# Patient Record
Sex: Female | Born: 1980
Health system: Southern US, Community
[De-identification: ages and names within clinical notes are randomized; demographics above are authoritative.]

## PROBLEM LIST (undated history)

## (undated) DIAGNOSIS — Z9889 Other specified postprocedural states: Secondary | ICD-10-CM

## (undated) HISTORY — PX: MANDIBLE FRACTURE SURGERY: SHX706

---
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
## 2007-12-10 ENCOUNTER — Emergency Department (HOSPITAL_COMMUNITY): Admission: EM | Admit: 2007-12-10 | Discharge: 2007-12-10 | Payer: Self-pay | Admitting: Family Medicine

## 2010-03-25 ENCOUNTER — Emergency Department (HOSPITAL_COMMUNITY): Admission: EM | Admit: 2010-03-25 | Discharge: 2010-03-25 | Payer: Self-pay | Admitting: Emergency Medicine

## 2016-02-29 ENCOUNTER — Ambulatory Visit (HOSPITAL_COMMUNITY)
Admission: EM | Admit: 2016-02-29 | Discharge: 2016-02-29 | Disposition: A | Discharge status: Home / Self Care | Payer: Commercial Managed Care - HMO | Attending: Family Medicine | Admitting: Family Medicine

## 2016-02-29 ENCOUNTER — Ambulatory Visit (INDEPENDENT_AMBULATORY_CARE_PROVIDER_SITE_OTHER): Payer: Commercial Managed Care - HMO

## 2016-02-29 ENCOUNTER — Encounter (HOSPITAL_COMMUNITY): Payer: Self-pay | Admitting: Emergency Medicine

## 2016-02-29 DIAGNOSIS — S93402A Sprain of unspecified ligament of left ankle, initial encounter: Secondary | ICD-10-CM

## 2016-02-29 HISTORY — DX: Other specified postprocedural states: Z98.890

## 2016-02-29 NOTE — Discharge Instructions (Signed)
Wear ankle support as needed for comfort, activity as tolerated. advil and warm soaks as needed, return or see orthopedist if further problems. °

## 2016-02-29 NOTE — ED Notes (Signed)
Patient reports missing a step on Thursday (02/25/16), twisted her left ankle.  Reports wrapping ankle over the weekend and did a lot of walking.  Reports swelling is improved.  Some swelling remains and slight discoloration.

## 2016-02-29 NOTE — ED Provider Notes (Signed)
CSN: [id number]     Arrival date & time 02/29/16  1258 History   First MD Initiated Contact with Patient 02/29/16 1312     Chief Complaint  Patient presents with  . Ankle Pain   (Consider location/radiation/quality/duration/timing/severity/associated sxs/prior Treatment) Patient is a 35 y.o. female presenting with ankle pain. The history is provided by the patient.  Ankle Pain Location:  Ankle Time since incident:  4 days Injury: yes   Mechanism of injury comment:  Rolled ankle then went to [facility] and did a lot of walking. Ankle location:  L ankle Pain details:    Quality:  Pressure   Severity:  Mild   Progression:  Unchanged Chronicity:  New Dislocation: no   Relieved by:  None tried Ineffective treatments:  Compression   Past Medical History  Diagnosis Date  . H/O thumb surgery    Past Surgical History  Procedure Laterality Date  . Mandible fracture surgery     No family history on file. Social History  Substance Use Topics  . Smoking status: Never Smoker   . Smokeless tobacco: None  . Alcohol Use: Yes   OB History    No data available     Review of Systems  Constitutional: Negative.   Musculoskeletal: Positive for joint swelling.  Skin: Negative.   All other systems reviewed and are negative.   Allergies  Review of patient's allergies indicates no known allergies.  Home Medications   Prior to Admission medications   Medication Sig Start Date End Date Taking? Authorizing Provider  ALPRAZolam (XANAX XR) 1 MG 24 hr tablet Take 1 mg by mouth daily.   Yes Historical Provider, MD  amphetamine-dextroamphetamine (ADDERALL) 30 MG tablet Take 30 mg by mouth daily.   Yes Historical Provider, MD  ibuprofen (ADVIL,MOTRIN) 200 MG tablet Take 200 mg by mouth every 6 (six) hours as needed.   Yes Historical Provider, MD   Meds Ordered and Administered this Visit  Medications - No data to display  BP 155/97 mmHg  Pulse 100  Temp(Src) 99 F (37.2 C) (Oral)   Resp 16  SpO2 100%  LMP 02/11/2016 No data found.   Physical Exam  Constitutional: She is oriented to person, place, and time. She appears well-developed and well-nourished. No distress.  Musculoskeletal: She exhibits tenderness.       Left ankle: She exhibits decreased range of motion, swelling and ecchymosis. She exhibits normal pulse. Tenderness. Lateral malleolus and AITFL tenderness found. No medial malleolus, no head of 5th metatarsal and no proximal fibula tenderness found. Achilles tendon normal.  Neurological: She is alert and oriented to person, place, and time.  Skin: Skin is warm and dry.  Nursing note and vitals reviewed.   ED Course  Procedures (including critical care time)  Labs Review Labs Reviewed - No data to display  Imaging Review Dg Ankle Complete Left  02/29/2016  CLINICAL DATA:  Pt fell off a step Thursday morning leaving to catch a plane,pain to the lateral side of the foot still, pain was also at the instep when it first happened EXAM: LEFT ANKLE COMPLETE - 3+ VIEW COMPARISON:  None. FINDINGS: There is no evidence of fracture, dislocation, or joint effusion. There is no evidence of arthropathy or other focal bone abnormality. Soft tissues are unremarkable. IMPRESSION: Negative. Electronically Signed   By: Esperanza Heir M.D.   On: 02/29/2016 13:43   X-rays reviewed and report per radiologist.   Visual Acuity Review  Right Eye Distance:   Left  Eye Distance:   Bilateral Distance:    Right Eye Near:   Left Eye Near:    Bilateral Near:         MDM   1. Ankle sprain, left, initial encounter    Pt does not require pain meds.    Linna HoffJames D Kindl, MD 02/29/16 [phone]

## 2017-01-18 DIAGNOSIS — Z6822 Body mass index (BMI) 22.0-22.9, adult: Secondary | ICD-10-CM | POA: Diagnosis not present

## 2017-01-18 DIAGNOSIS — Z01419 Encounter for gynecological examination (general) (routine) without abnormal findings: Secondary | ICD-10-CM | POA: Diagnosis not present

## 2017-03-02 DIAGNOSIS — R030 Elevated blood-pressure reading, without diagnosis of hypertension: Secondary | ICD-10-CM | POA: Diagnosis not present

## 2017-09-01 DIAGNOSIS — R030 Elevated blood-pressure reading, without diagnosis of hypertension: Secondary | ICD-10-CM | POA: Diagnosis not present

## 2017-09-01 DIAGNOSIS — Z13220 Encounter for screening for lipoid disorders: Secondary | ICD-10-CM | POA: Diagnosis not present

## 2017-09-01 DIAGNOSIS — Z23 Encounter for immunization: Secondary | ICD-10-CM | POA: Diagnosis not present

## 2017-10-16 IMAGING — DX DG ANKLE COMPLETE 3+V*L*
3 series · 3 of 3 positions shown · non-contrast
Comparison: None.

CLINICAL DATA: Pt fell off a step [REDACTED] morning leaving to catch
a plane,pain to the lateral side of the foot still, pain was also at
the instep when it first happened

EXAM:
LEFT ANKLE COMPLETE - 3+ VIEW

[ankle ap]
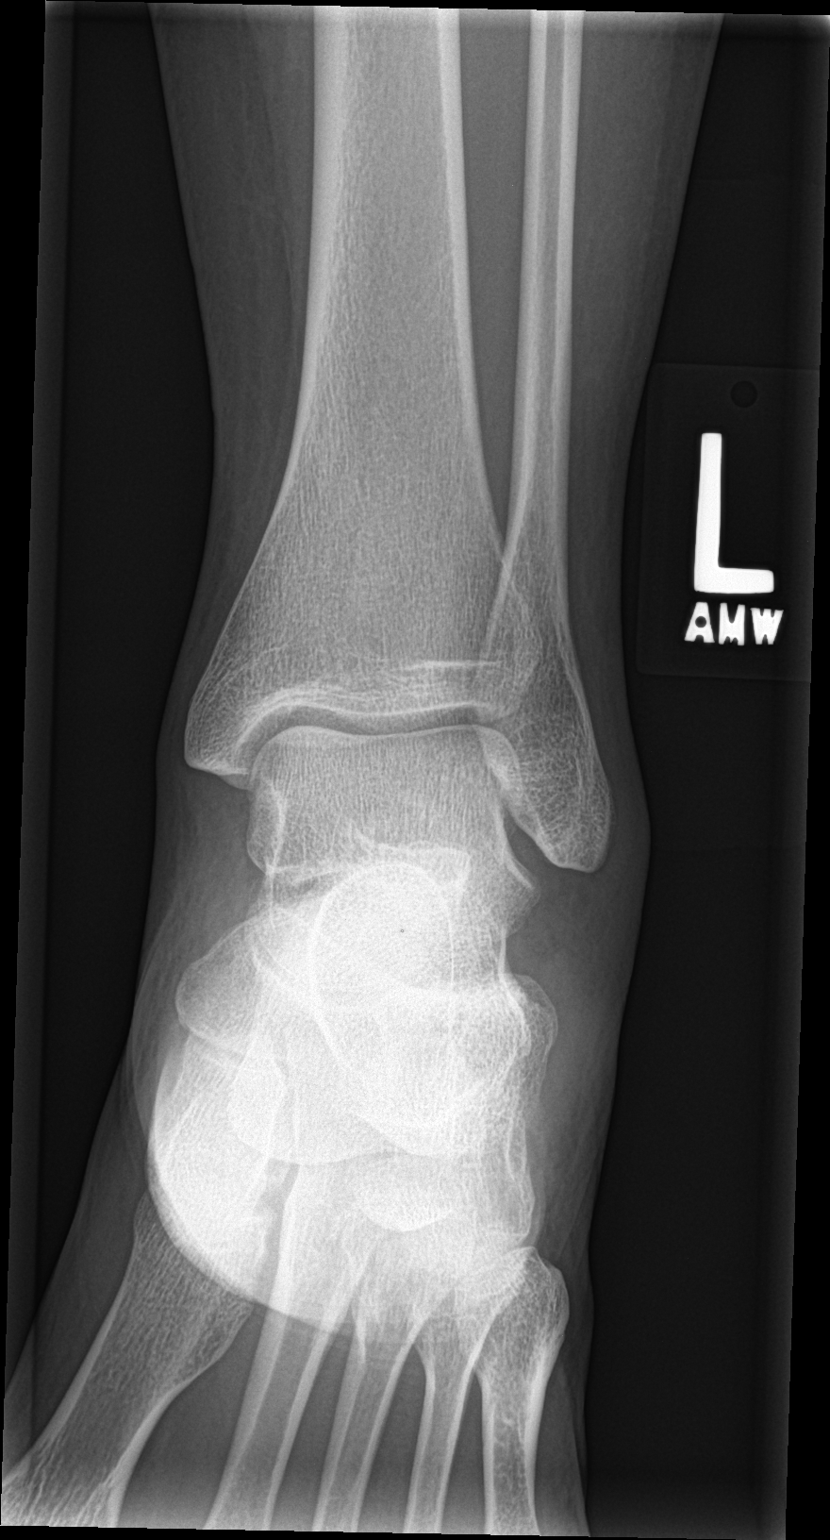

[ankle obl]
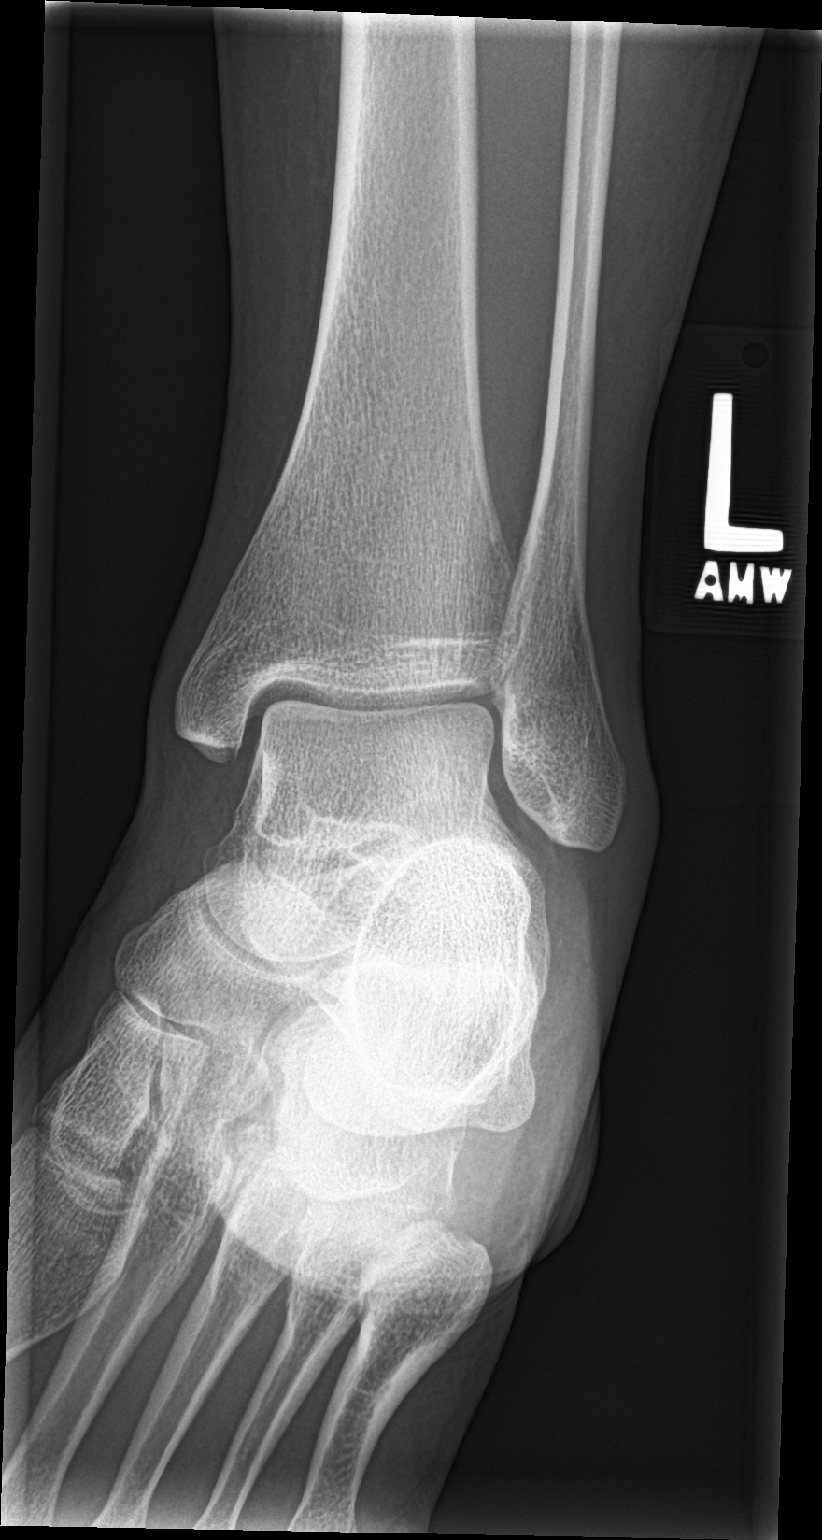

[ankle lat]
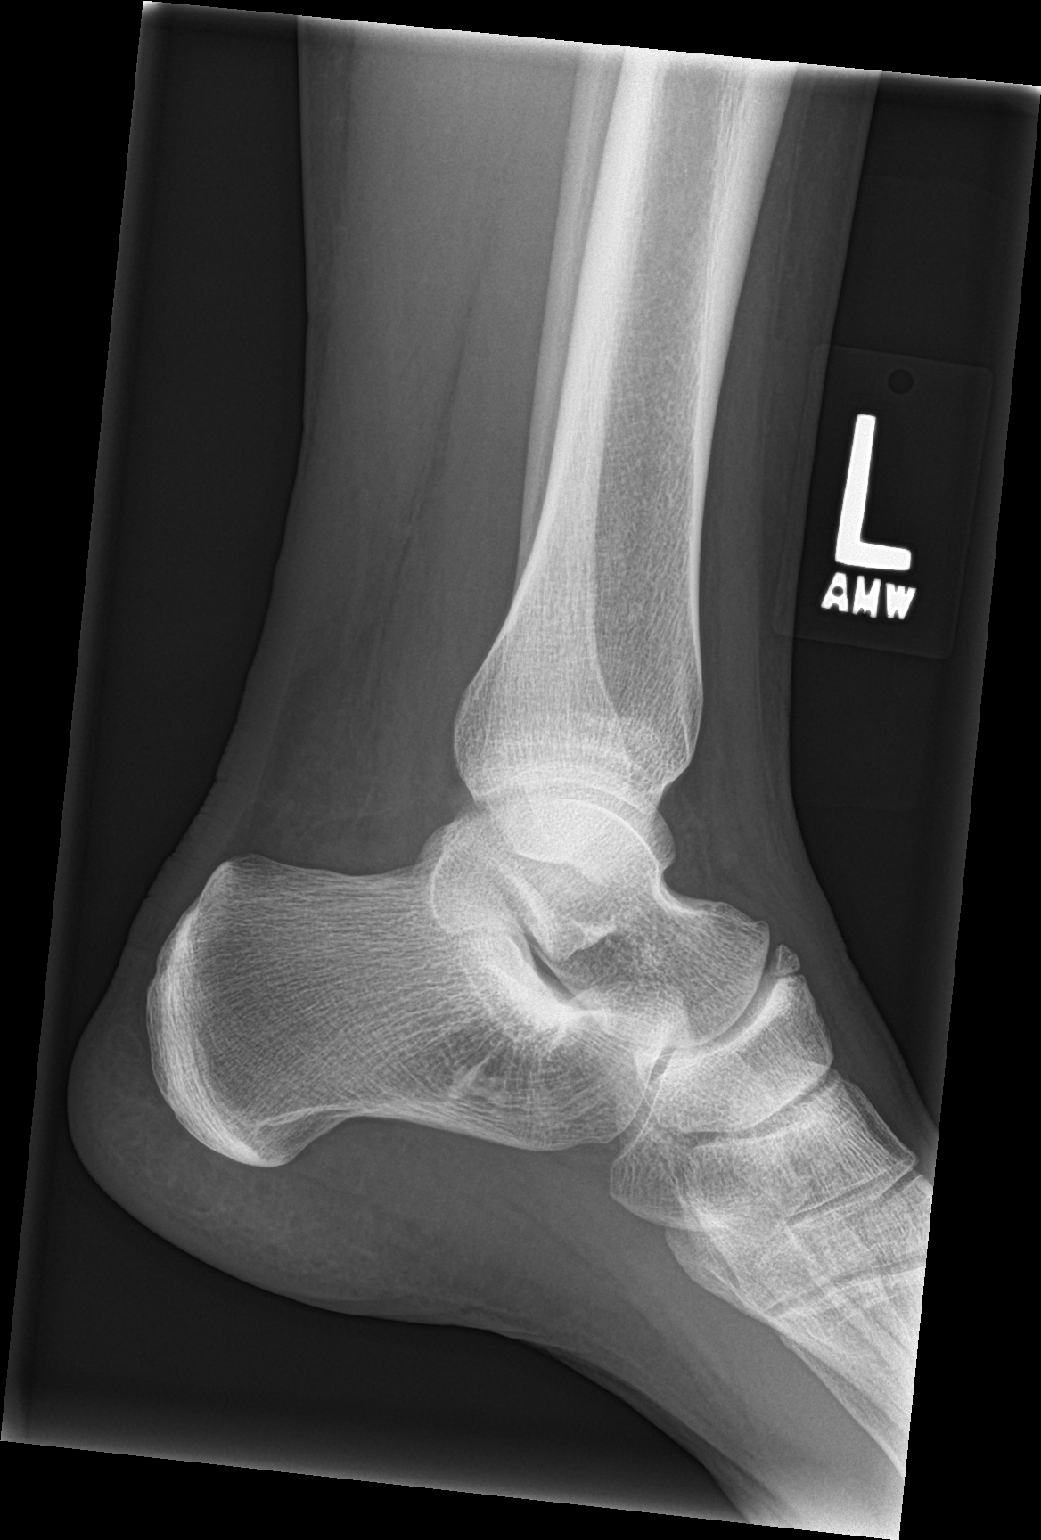

[3 of 3 positions shown; findings below may reference images not displayed]

FINDINGS: There is no evidence of fracture, dislocation, or joint effusion.
There is no evidence of arthropathy or other focal bone abnormality.
Soft tissues are unremarkable.
IMPRESSION: Negative.

## 2018-02-13 DIAGNOSIS — Z01419 Encounter for gynecological examination (general) (routine) without abnormal findings: Secondary | ICD-10-CM | POA: Diagnosis not present

## 2018-02-13 DIAGNOSIS — Z6821 Body mass index (BMI) 21.0-21.9, adult: Secondary | ICD-10-CM | POA: Diagnosis not present

## 2018-07-17 DIAGNOSIS — H10413 Chronic giant papillary conjunctivitis, bilateral: Secondary | ICD-10-CM | POA: Diagnosis not present

## 2018-09-04 DIAGNOSIS — Z136 Encounter for screening for cardiovascular disorders: Secondary | ICD-10-CM | POA: Diagnosis not present

## 2018-09-04 DIAGNOSIS — Z23 Encounter for immunization: Secondary | ICD-10-CM | POA: Diagnosis not present

## 2018-12-24 DIAGNOSIS — J069 Acute upper respiratory infection, unspecified: Secondary | ICD-10-CM | POA: Diagnosis not present

## 2018-12-29 DIAGNOSIS — J0110 Acute frontal sinusitis, unspecified: Secondary | ICD-10-CM | POA: Diagnosis not present

## 2019-03-07 DIAGNOSIS — R030 Elevated blood-pressure reading, without diagnosis of hypertension: Secondary | ICD-10-CM | POA: Diagnosis not present

## 2019-09-12 DIAGNOSIS — Z6820 Body mass index (BMI) 20.0-20.9, adult: Secondary | ICD-10-CM | POA: Diagnosis not present

## 2019-09-12 DIAGNOSIS — Z01419 Encounter for gynecological examination (general) (routine) without abnormal findings: Secondary | ICD-10-CM | POA: Diagnosis not present

## 2019-09-13 DIAGNOSIS — F900 Attention-deficit hyperactivity disorder, predominantly inattentive type: Secondary | ICD-10-CM | POA: Diagnosis not present

## 2019-11-02 DIAGNOSIS — Z20828 Contact with and (suspected) exposure to other viral communicable diseases: Secondary | ICD-10-CM | POA: Diagnosis not present

## 2020-03-13 DIAGNOSIS — F900 Attention-deficit hyperactivity disorder, predominantly inattentive type: Secondary | ICD-10-CM | POA: Diagnosis not present

## 2020-05-29 DIAGNOSIS — Z20822 Contact with and (suspected) exposure to covid-19: Secondary | ICD-10-CM | POA: Diagnosis not present

## 2020-05-29 DIAGNOSIS — Z03818 Encounter for observation for suspected exposure to other biological agents ruled out: Secondary | ICD-10-CM | POA: Diagnosis not present

## 2020-08-25 DIAGNOSIS — L738 Other specified follicular disorders: Secondary | ICD-10-CM | POA: Diagnosis not present

## 2020-08-25 DIAGNOSIS — D763 Other histiocytosis syndromes: Secondary | ICD-10-CM | POA: Diagnosis not present

## 2020-08-25 DIAGNOSIS — D235 Other benign neoplasm of skin of trunk: Secondary | ICD-10-CM | POA: Diagnosis not present

## 2020-09-14 DIAGNOSIS — F900 Attention-deficit hyperactivity disorder, predominantly inattentive type: Secondary | ICD-10-CM | POA: Diagnosis not present

## 2020-09-14 DIAGNOSIS — Z23 Encounter for immunization: Secondary | ICD-10-CM | POA: Diagnosis not present

## 2021-03-15 DIAGNOSIS — F900 Attention-deficit hyperactivity disorder, predominantly inattentive type: Secondary | ICD-10-CM | POA: Diagnosis not present

## 2021-03-15 DIAGNOSIS — F419 Anxiety disorder, unspecified: Secondary | ICD-10-CM | POA: Diagnosis not present

## 2021-08-09 DIAGNOSIS — Z01419 Encounter for gynecological examination (general) (routine) without abnormal findings: Secondary | ICD-10-CM | POA: Diagnosis not present

## 2021-08-09 DIAGNOSIS — Z6821 Body mass index (BMI) 21.0-21.9, adult: Secondary | ICD-10-CM | POA: Diagnosis not present

## 2021-08-09 DIAGNOSIS — N926 Irregular menstruation, unspecified: Secondary | ICD-10-CM | POA: Diagnosis not present

## 2021-09-07 DIAGNOSIS — Z23 Encounter for immunization: Secondary | ICD-10-CM | POA: Diagnosis not present

## 2021-09-07 DIAGNOSIS — F900 Attention-deficit hyperactivity disorder, predominantly inattentive type: Secondary | ICD-10-CM | POA: Diagnosis not present

## 2021-09-07 DIAGNOSIS — F419 Anxiety disorder, unspecified: Secondary | ICD-10-CM | POA: Diagnosis not present

## 2021-12-21 DIAGNOSIS — Z03818 Encounter for observation for suspected exposure to other biological agents ruled out: Secondary | ICD-10-CM | POA: Diagnosis not present

## 2021-12-21 DIAGNOSIS — R059 Cough, unspecified: Secondary | ICD-10-CM | POA: Diagnosis not present

## 2021-12-21 DIAGNOSIS — R0981 Nasal congestion: Secondary | ICD-10-CM | POA: Diagnosis not present

## 2021-12-21 DIAGNOSIS — J019 Acute sinusitis, unspecified: Secondary | ICD-10-CM | POA: Diagnosis not present

## 2022-03-07 DIAGNOSIS — L309 Dermatitis, unspecified: Secondary | ICD-10-CM | POA: Diagnosis not present

## 2022-03-07 DIAGNOSIS — F900 Attention-deficit hyperactivity disorder, predominantly inattentive type: Secondary | ICD-10-CM | POA: Diagnosis not present

## 2022-03-07 DIAGNOSIS — F419 Anxiety disorder, unspecified: Secondary | ICD-10-CM | POA: Diagnosis not present

## 2022-09-07 DIAGNOSIS — F900 Attention-deficit hyperactivity disorder, predominantly inattentive type: Secondary | ICD-10-CM | POA: Diagnosis not present

## 2022-09-07 DIAGNOSIS — Z23 Encounter for immunization: Secondary | ICD-10-CM | POA: Diagnosis not present

## 2022-09-07 DIAGNOSIS — R030 Elevated blood-pressure reading, without diagnosis of hypertension: Secondary | ICD-10-CM | POA: Diagnosis not present

## 2022-11-11 DIAGNOSIS — R030 Elevated blood-pressure reading, without diagnosis of hypertension: Secondary | ICD-10-CM | POA: Diagnosis not present

## 2022-11-11 DIAGNOSIS — J0101 Acute recurrent maxillary sinusitis: Secondary | ICD-10-CM | POA: Diagnosis not present

## 2023-03-10 DIAGNOSIS — Z01419 Encounter for gynecological examination (general) (routine) without abnormal findings: Secondary | ICD-10-CM | POA: Diagnosis not present

## 2023-03-10 DIAGNOSIS — Z1151 Encounter for screening for human papillomavirus (HPV): Secondary | ICD-10-CM | POA: Diagnosis not present

## 2023-03-10 DIAGNOSIS — Z6821 Body mass index (BMI) 21.0-21.9, adult: Secondary | ICD-10-CM | POA: Diagnosis not present

## 2023-03-10 DIAGNOSIS — Z124 Encounter for screening for malignant neoplasm of cervix: Secondary | ICD-10-CM | POA: Diagnosis not present

## 2023-03-15 DIAGNOSIS — R030 Elevated blood-pressure reading, without diagnosis of hypertension: Secondary | ICD-10-CM | POA: Diagnosis not present

## 2023-03-15 DIAGNOSIS — F900 Attention-deficit hyperactivity disorder, predominantly inattentive type: Secondary | ICD-10-CM | POA: Diagnosis not present

## 2023-04-04 DIAGNOSIS — Z30430 Encounter for insertion of intrauterine contraceptive device: Secondary | ICD-10-CM | POA: Diagnosis not present

## 2023-05-24 DIAGNOSIS — Z30431 Encounter for routine checking of intrauterine contraceptive device: Secondary | ICD-10-CM | POA: Diagnosis not present

## 2023-05-24 DIAGNOSIS — Z1231 Encounter for screening mammogram for malignant neoplasm of breast: Secondary | ICD-10-CM | POA: Diagnosis not present

## 2023-07-03 DIAGNOSIS — M79672 Pain in left foot: Secondary | ICD-10-CM | POA: Diagnosis not present

## 2023-09-19 DIAGNOSIS — R030 Elevated blood-pressure reading, without diagnosis of hypertension: Secondary | ICD-10-CM | POA: Diagnosis not present

## 2023-09-19 DIAGNOSIS — F900 Attention-deficit hyperactivity disorder, predominantly inattentive type: Secondary | ICD-10-CM | POA: Diagnosis not present

## 2023-09-19 DIAGNOSIS — Z23 Encounter for immunization: Secondary | ICD-10-CM | POA: Diagnosis not present

## 2023-09-26 DIAGNOSIS — R030 Elevated blood-pressure reading, without diagnosis of hypertension: Secondary | ICD-10-CM | POA: Diagnosis not present

## 2023-09-26 DIAGNOSIS — R7989 Other specified abnormal findings of blood chemistry: Secondary | ICD-10-CM | POA: Diagnosis not present

## 2023-09-26 DIAGNOSIS — Z13220 Encounter for screening for lipoid disorders: Secondary | ICD-10-CM | POA: Diagnosis not present

## 2023-10-15 DIAGNOSIS — L03113 Cellulitis of right upper limb: Secondary | ICD-10-CM | POA: Diagnosis not present

## 2024-02-01 DIAGNOSIS — D225 Melanocytic nevi of trunk: Secondary | ICD-10-CM | POA: Diagnosis not present

## 2024-02-01 DIAGNOSIS — D485 Neoplasm of uncertain behavior of skin: Secondary | ICD-10-CM | POA: Diagnosis not present

## 2024-02-01 DIAGNOSIS — D3612 Benign neoplasm of peripheral nerves and autonomic nervous system, upper limb, including shoulder: Secondary | ICD-10-CM | POA: Diagnosis not present

## 2024-02-01 DIAGNOSIS — D2271 Melanocytic nevi of right lower limb, including hip: Secondary | ICD-10-CM | POA: Diagnosis not present

## 2024-02-01 DIAGNOSIS — D2362 Other benign neoplasm of skin of left upper limb, including shoulder: Secondary | ICD-10-CM | POA: Diagnosis not present

## 2024-02-01 DIAGNOSIS — L821 Other seborrheic keratosis: Secondary | ICD-10-CM | POA: Diagnosis not present

## 2024-03-22 DIAGNOSIS — R945 Abnormal results of liver function studies: Secondary | ICD-10-CM | POA: Diagnosis not present

## 2024-03-22 DIAGNOSIS — F900 Attention-deficit hyperactivity disorder, predominantly inattentive type: Secondary | ICD-10-CM | POA: Diagnosis not present

## 2024-03-22 DIAGNOSIS — F419 Anxiety disorder, unspecified: Secondary | ICD-10-CM | POA: Diagnosis not present

## 2024-03-22 DIAGNOSIS — R030 Elevated blood-pressure reading, without diagnosis of hypertension: Secondary | ICD-10-CM | POA: Diagnosis not present

## 2024-05-14 DIAGNOSIS — L239 Allergic contact dermatitis, unspecified cause: Secondary | ICD-10-CM | POA: Diagnosis not present

## 2024-06-27 DIAGNOSIS — Z6822 Body mass index (BMI) 22.0-22.9, adult: Secondary | ICD-10-CM | POA: Diagnosis not present

## 2024-06-27 DIAGNOSIS — Z01419 Encounter for gynecological examination (general) (routine) without abnormal findings: Secondary | ICD-10-CM | POA: Diagnosis not present

## 2024-07-04 DIAGNOSIS — H10413 Chronic giant papillary conjunctivitis, bilateral: Secondary | ICD-10-CM | POA: Diagnosis not present

## 2024-09-16 DIAGNOSIS — Z1231 Encounter for screening mammogram for malignant neoplasm of breast: Secondary | ICD-10-CM | POA: Diagnosis not present

## 2024-10-22 DIAGNOSIS — R030 Elevated blood-pressure reading, without diagnosis of hypertension: Secondary | ICD-10-CM | POA: Diagnosis not present

## 2024-10-22 DIAGNOSIS — R945 Abnormal results of liver function studies: Secondary | ICD-10-CM | POA: Diagnosis not present

## 2024-10-22 DIAGNOSIS — F900 Attention-deficit hyperactivity disorder, predominantly inattentive type: Secondary | ICD-10-CM | POA: Diagnosis not present

## 2024-10-22 DIAGNOSIS — F419 Anxiety disorder, unspecified: Secondary | ICD-10-CM | POA: Diagnosis not present

## 2024-10-22 DIAGNOSIS — Z23 Encounter for immunization: Secondary | ICD-10-CM | POA: Diagnosis not present
